# Patient Record
Sex: Female | Born: 1965
Health system: Southern US, Community
[De-identification: ages and names within clinical notes are randomized; demographics above are authoritative.]

## PROBLEM LIST (undated history)

## (undated) DIAGNOSIS — I10 Essential (primary) hypertension: Secondary | ICD-10-CM

## (undated) HISTORY — PX: BREAST CYST ASPIRATION: SHX578

## (undated) HISTORY — DX: Essential (primary) hypertension: I10

---

## 2017-11-15 DIAGNOSIS — M25561 Pain in right knee: Secondary | ICD-10-CM | POA: Diagnosis not present

## 2018-07-23 ENCOUNTER — Other Ambulatory Visit: Payer: Self-pay

## 2018-07-23 ENCOUNTER — Emergency Department (HOSPITAL_COMMUNITY): Payer: BLUE CROSS/BLUE SHIELD

## 2018-07-23 ENCOUNTER — Encounter (HOSPITAL_COMMUNITY): Payer: Self-pay

## 2018-07-23 ENCOUNTER — Emergency Department (HOSPITAL_COMMUNITY)
Admission: EM | Admit: 2018-07-23 | Discharge: 2018-07-23 | Disposition: A | Discharge status: Home / Self Care | Payer: BLUE CROSS/BLUE SHIELD | Attending: Emergency Medicine | Admitting: Emergency Medicine

## 2018-07-23 DIAGNOSIS — I499 Cardiac arrhythmia, unspecified: Secondary | ICD-10-CM | POA: Diagnosis not present

## 2018-07-23 DIAGNOSIS — F17210 Nicotine dependence, cigarettes, uncomplicated: Secondary | ICD-10-CM | POA: Diagnosis not present

## 2018-07-23 DIAGNOSIS — R05 Cough: Secondary | ICD-10-CM | POA: Diagnosis not present

## 2018-07-23 DIAGNOSIS — R0789 Other chest pain: Secondary | ICD-10-CM | POA: Insufficient documentation

## 2018-07-23 DIAGNOSIS — J069 Acute upper respiratory infection, unspecified: Secondary | ICD-10-CM | POA: Diagnosis not present

## 2018-07-23 DIAGNOSIS — R079 Chest pain, unspecified: Secondary | ICD-10-CM | POA: Diagnosis not present

## 2018-07-23 DIAGNOSIS — I10 Essential (primary) hypertension: Secondary | ICD-10-CM | POA: Diagnosis not present

## 2018-07-23 DIAGNOSIS — R0602 Shortness of breath: Secondary | ICD-10-CM | POA: Diagnosis not present

## 2018-07-23 LAB — COMPREHENSIVE METABOLIC PANEL
ALT: 14 U/L (ref 0–44)
AST: 19 U/L (ref 15–41)
Albumin: 3.8 g/dL (ref 3.5–5.0)
Alkaline Phosphatase: 55 U/L (ref 38–126)
Anion gap: 10 (ref 5–15)
BUN: 10 mg/dL (ref 6–20)
CO2: 21 mmol/L — ABNORMAL LOW (ref 22–32)
Calcium: 9.2 mg/dL (ref 8.9–10.3)
Chloride: 109 mmol/L (ref 98–111)
Creatinine, Ser: 0.79 mg/dL (ref 0.44–1.00)
GFR calc Af Amer: >60 mL/min (ref >60)
Glucose, Bld: 81 mg/dL (ref 70–99)
Potassium: 3.6 mmol/L (ref 3.5–5.1)
Sodium: 140 mmol/L (ref 135–145)
Total Bilirubin: 0.6 mg/dL (ref 0.3–1.2)
Total Protein: 6.4 g/dL — ABNORMAL LOW (ref 6.5–8.1)

## 2018-07-23 LAB — CBC WITH DIFFERENTIAL/PLATELET
Abs Immature Granulocytes: 0.03 10*3/uL (ref 0.00–0.07)
Basophils Absolute: 0.1 10*3/uL (ref 0.0–0.1)
Basophils Relative: 1 %
Eosinophils Absolute: 0.1 10*3/uL (ref 0.0–0.5)
Eosinophils Relative: 2 %
HCT: 38.6 % (ref 36.0–46.0)
Hemoglobin: 12.4 g/dL (ref 12.0–15.0)
Immature Granulocytes: 0 %
Lymphocytes Relative: 34 %
Lymphs Abs: 3.1 10*3/uL (ref 0.7–4.0)
MCH: 31.3 pg (ref 26.0–34.0)
MCHC: 32.1 g/dL (ref 30.0–36.0)
MCV: 97.5 fL (ref 80.0–100.0)
MONOS PCT: 7 %
Monocytes Absolute: 0.6 10*3/uL (ref 0.1–1.0)
Neutro Abs: 5.2 10*3/uL (ref 1.7–7.7)
Neutrophils Relative %: 56 %
Platelets: 325 10*3/uL (ref 150–400)
RBC: 3.96 MIL/uL (ref 3.87–5.11)
RDW: 12.7 % (ref 11.5–15.5)
WBC: 9.1 10*3/uL (ref 4.0–10.5)
nRBC: 0 % (ref 0.0–0.2)

## 2018-07-23 LAB — I-STAT TROPONIN, ED: Troponin i, poc: 0 ng/mL (ref 0.00–0.08)

## 2018-07-23 MED ORDER — IPRATROPIUM-ALBUTEROL 0.5-2.5 (3) MG/3ML IN SOLN
3.0000 mL | Freq: Once | RESPIRATORY_TRACT | Status: AC
  Administered 2018-07-23: 3 mL via RESPIRATORY_TRACT
  Filled 2018-07-23: qty 3

## 2018-07-23 MED ORDER — ALBUTEROL SULFATE HFA 108 (90 BASE) MCG/ACT IN AERS
2.0000 | INHALATION_SPRAY | Freq: Once | RESPIRATORY_TRACT | Status: AC
  Administered 2018-07-23: 2 via RESPIRATORY_TRACT
  Filled 2018-07-23: qty 6.7

## 2018-07-23 NOTE — ED Triage Notes (Signed)
Pt from walk in clinic via ems; c/o central chest pressure x 1 week ago; no radiation; pt endorses sinus cold x 1 weeks ago; lightheadedness and sob associated w/ chest pressure; denies chest pressure currently  Pt given 324 ASA PTA

## 2018-07-23 NOTE — ED Provider Notes (Signed)
[facility] EMERGENCY DEPARTMENT Provider Note   CSN: [id number] Arrival date & time: 07/23/18  1451    History   Chief Complaint Chief Complaint  Patient presents with  . Chest Pain    HPI Jill Moore is a 53 y.o. female.     HPI   Pt is a 53 y/o female with no known PMHx who presents to the ED today c/o chest pressure that has been present for the last week. States sxs located to the center of her chest. Denies pain. States sxs have been intermittent. Sxs have been constant today. Has intermittent sob without any specific pattern. Not specifically with exertion. None currently. She does not currently feel lightheaded. States that when she is on the treadmill her sxs seem to feel improved. She states that the more active she is the less her sxs seem to impact her.  She notes that she recently had a cold and has had nasal congestion, rhinorrhea, and a cough for the last week or so. Cough is productive of white mucous. Denies fevers.   Denies leg pain/swelling, hemoptysis, recent surgery/trauma, recent long travel, hormone use, personal hx of cancer, or hx of DVT/PE.   Pt seen at [facility] in clinic pta and sent here due to abn ekg and chest pain.   Denies known h/o HTN or DM. States she has "borderline high cholesterol" but does not take meds. Does use tobacco.   History reviewed. No pertinent past medical history.  There are no active problems to display for this patient.  History reviewed. No pertinent surgical history.   OB History   No obstetric history on file.      Home Medications    Prior to Admission medications   Not on File    Family History No family history on file.  Social History Social History   Tobacco Use  . Smoking status: Current Every Day Smoker    Packs/day: 1.00    Types: Cigarettes  . Smokeless tobacco: Never Used  Substance Use Topics  . Alcohol use: Yes    Comment: "2 glasses of wine per night"  . Drug use:  Never     Allergies   Patient has no allergy information on record.   Review of Systems Review of Systems  Constitutional: Negative for chills and fever.  HENT: Positive for congestion and rhinorrhea. Negative for ear pain and sore throat.   Eyes: Negative for visual disturbance.  Respiratory: Positive for cough and shortness of breath.   Cardiovascular: Positive for chest pain. Negative for leg swelling.  Gastrointestinal: Negative for abdominal pain, constipation, diarrhea, nausea and vomiting.  Genitourinary: Negative for dysuria and hematuria.  Musculoskeletal: Negative for back pain.  Skin: Negative for rash.  Neurological: Positive for light-headedness. Negative for dizziness, weakness, numbness and headaches.  All other systems reviewed and are negative.  Physical Exam Updated Vital Signs BP (!) 157/81 (BP Location: Right Arm)   Pulse 62   Temp 98.5 F (36.9 C) (Oral)   Resp 18   Ht  (1.626 m)   Wt 60.8 kg   SpO2 98%   BMI 23.00 kg/m   Physical Exam Vitals signs and nursing note reviewed.  Constitutional:      General: She is not in acute distress.    Appearance: She is well-developed.  HENT:     Head: Normocephalic and atraumatic.  Eyes:     Conjunctiva/sclera: Conjunctivae normal.  Neck:     Musculoskeletal: Neck supple.  Cardiovascular:     Rate and Rhythm: Normal rate and regular rhythm.     Heart sounds: No murmur.  Pulmonary:     Effort: Pulmonary effort is normal. No respiratory distress.     Breath sounds: Normal breath sounds. No decreased breath sounds, wheezing, rhonchi or rales.  Abdominal:     Palpations: Abdomen is soft.     Tenderness: There is no abdominal tenderness.  Musculoskeletal:     Right lower leg: She exhibits no tenderness. No edema.     Left lower leg: She exhibits no tenderness. No edema.  Skin:    General: Skin is warm and dry.  Neurological:     Mental Status: She is alert.     ED Treatments / Results  Labs  (all labs ordered are listed, but only abnormal results are displayed) Labs Reviewed  COMPREHENSIVE METABOLIC PANEL - Abnormal; Notable for the following components:      Result Value   CO2 21 (*)    Total Protein 6.4 (*)    All other components within normal limits  CBC WITH DIFFERENTIAL/PLATELET  I-STAT TROPONIN, ED    EKG EKG Interpretation  Date/Time:  Friday July 23 2018 15:08:08 EDT Ventricular Rate:  71 PR Interval:    QRS Duration: 68 QT Interval:  455 QTC Calculation: 495 R Axis:   76 Text Interpretation:  Sinus rhythm Consider left ventricular hypertrophy Anterior Q waves, possibly due to LVH no prior available for comparison Confirmed by Tilden Fossa [phone]) on 07/23/2018 3:50:28 PM   Radiology Dg Chest 2 View  Result Date: 07/23/2018 CLINICAL DATA:  Cough with shortness of breath and chest pain EXAM: CHEST - 2 VIEW COMPARISON:  None. FINDINGS: Lungs are clear. Heart size and pulmonary vascularity are normal. No adenopathy. No pneumothorax. No bone lesions. IMPRESSION: No edema or consolidation. Electronically Signed   By: Bretta Bang III M.D.   On: 07/23/2018 16:28    Procedures Procedures (including critical care time)  Medications Ordered in ED Medications  ipratropium-albuterol (DUONEB) 0.5-2.5 (3) MG/3ML nebulizer solution 3 mL (3 mLs Nebulization Given 07/23/18 1635)  albuterol (PROVENTIL HFA;VENTOLIN HFA) 108 (90 Base) MCG/ACT inhaler 2 puff (2 puffs Inhalation Given 07/23/18 1815)     Initial Impression / Assessment and Plan / ED Course  I have reviewed the triage vital signs and the nursing notes.  Pertinent labs & imaging results that were available during my care of the patient were reviewed by me and considered in my medical decision making (see chart for details).     Final Clinical Impressions(s) / ED Diagnoses   Final diagnoses:  Atypical chest pain  Upper respiratory tract infection, unspecified type  Hypertension, unspecified  type   Patient presenting with chest tightness in setting of recent URI symptoms.  She has no shortness of breath currently.  She is hypertensive in the ED but otherwise vital signs are within normal limits.  Low suspicion for hypertensive emergency at this time.  CBC is reassuring.  No leukocytosis or anemia.  CMP with normal electrolytes kidney and liver function.  Bicarb is very mildly low however I feel this is nonspecific and not contributing to symptoms.  Troponin is negative.  Chest x-ray is negative for acute cardiac or pulmonary etiology  EKG shows normal sinus rhythm with no ischemic changes with LVH.  Patient's work-up is very reassuring.  I think that her symptoms are likely related to her recent URI.  Her symptoms seem very atypical for ACS given the  fact that her symptoms seem to be improved with exertion.  her symptoms also seem less likely to be secondary to PE as she has no risk factors at this time.  She is not tachycardic.  She is not hypoxic.  She has no shortness of breath.  I gave her a DuoNeb and on reevaluation she states that she feels much improved.  She is ambulating about the room in no acute distress and stating that she is is ready for discharge.  I will give her an albuterol inhaler for home.  I offered to treat her cough with cough medication however she declined a prescription for this and states she will buy something over-the-counter.  Advised her that she needs to follow-up with her PCP in regards to her visit today in regards to her elevated blood pressure readings.  Specific return precautions were discussed and she voices understanding of the plan and reasons to return.  All questions were answered.  Patient stable discharge.    ED Discharge Orders    None       Rayne Du 07/23/18 2059    Tilden Fossa, MD 07/23/18 2321

## 2018-07-23 NOTE — ED Notes (Signed)
Patient verbalizes understanding of discharge instructions. Opportunity for questioning and answers were provided. Pt discharged from ED. 

## 2018-07-23 NOTE — Discharge Instructions (Addendum)
Please follow up with your primary care provider within 5-7 days for re-evaluation of your symptoms.   Please return to the emergency department for any new or worsening symptoms including chest pain, shortness of breath, headaches, dizziness or lightheadedness.

## 2018-07-23 NOTE — ED Notes (Signed)
Pt to xray via stretcher

## 2018-08-23 DIAGNOSIS — I10 Essential (primary) hypertension: Secondary | ICD-10-CM | POA: Diagnosis not present

## 2018-08-23 DIAGNOSIS — Z720 Tobacco use: Secondary | ICD-10-CM | POA: Diagnosis not present

## 2018-08-23 DIAGNOSIS — R0789 Other chest pain: Secondary | ICD-10-CM | POA: Diagnosis not present

## 2018-09-03 ENCOUNTER — Telehealth: Payer: Self-pay | Admitting: Internal Medicine

## 2018-09-03 NOTE — Telephone Encounter (Signed)
Smartphone/consent/ my chart/ pre reg completed °

## 2018-09-06 ENCOUNTER — Encounter: Payer: Self-pay | Admitting: Internal Medicine

## 2018-09-06 ENCOUNTER — Telehealth (INDEPENDENT_AMBULATORY_CARE_PROVIDER_SITE_OTHER): Payer: BLUE CROSS/BLUE SHIELD | Admitting: Internal Medicine

## 2018-09-06 ENCOUNTER — Telehealth: Payer: BLUE CROSS/BLUE SHIELD | Admitting: Internal Medicine

## 2018-09-06 VITALS — BP 138/71 | HR 80 | Ht 63.0 in | Wt 131.2 lb

## 2018-09-06 DIAGNOSIS — I10 Essential (primary) hypertension: Secondary | ICD-10-CM | POA: Diagnosis not present

## 2018-09-06 DIAGNOSIS — R0789 Other chest pain: Secondary | ICD-10-CM

## 2018-09-06 DIAGNOSIS — Z716 Tobacco abuse counseling: Secondary | ICD-10-CM

## 2018-09-06 DIAGNOSIS — Z79899 Other long term (current) drug therapy: Secondary | ICD-10-CM

## 2018-09-06 DIAGNOSIS — Z720 Tobacco use: Secondary | ICD-10-CM | POA: Insufficient documentation

## 2018-09-06 NOTE — Progress Notes (Signed)
Virtual Visit via Video Note   This visit type was conducted due to national recommendations for restrictions regarding the COVID-19 Pandemic (e.g. social distancing) in an effort to limit this patient's exposure and mitigate transmission in our community.  Due to her co-morbid illnesses, this patient is at least at moderate risk for complications without adequate follow up.  This format is felt to be most appropriate for this patient at this time.  All issues noted in this document were discussed and addressed.  A limited physical exam was performed with this format.  Please refer to the patient's chart for her consent to telehealth for [facility].   Evaluation Performed:  Video visit  Date:  09/06/2018   ID:  Jill Moore, DOB [birth year], MRN [id number]  Patient Location:  [address] [place] [address]  Provider location:   [address], [address] [place], [place] [address]  PCP:  Farris Has, MD  Cardiologist:  No primary care provider on file. Electrophysiologist:  None   Chief Complaint:  New patient, ER follow-up for chest pain  History of Present Illness:    Jill Moore is a 53 y.o. female who presents via audio/video conferencing for a telehealth visit today.  This is a pleasant 53 year old female who recently moved to the area from [place].  She has a past medical history recently significant for hypertension although was untreated.  She is also been a long-term smoker between 1 and 1-1/2 packs/day.  She was seen in the ER in March of this year for chest pain.  She had had some upper respiratory symptoms.  She was describing a dull pressure in the center of her chest which was fairly constant.  It was not worse with exertion or relieved by rest.  She says she exercises fairly regularly and is kind of a "gym rat".  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  She does not seem to have worsening symptoms with that.  Work-up in the ER was fairly  benign.  Her chest x-ray was normal.  Her EKG did show some LVH by voltage and her blood pressure was elevated.  Additionally, her troponin was negative.  Since then she has had no new or worsening chest pain.  She does get occasional pressure which is more constant.  She also gets some intercostal crampy pain in certain positions.  She does describe some numbness and tingling of the last 3 fingers of the right hand.  She denies any cough, fever, chills or other concerning symptoms.  Family history significant for her mother who had TIA/stroke and hypertension.  She also has several siblings with diabetes and hypertension.  Her father had hypertension but died of cancer.  The patient does not have symptoms concerning for COVID-19 infection (fever, chills, cough, or new SHORTNESS OF BREATH).    Prior CV studies:   The following studies were reviewed today:  ER visit EKG Labs  PMHx:  Past Medical History:  Diagnosis Date   Essential hypertension     No past surgical history on file.  FAMHx:  Family History  Problem Relation Age of Onset   Stroke Mother    Hypertension Mother    Cancer Father    Hypertension Father    Diabetes Sister    Hypertension Sister    Diabetes Brother    Hypertension Brother     SOCHx:   reports that she has been smoking cigarettes. She has been smoking about 1.50 packs  per day. She has never used smokeless tobacco. She reports current alcohol use. She reports that she does not use drugs.  ALLERGIES:  No Known Allergies  MEDS:  Current Meds  Medication Sig   amLODipine (NORVASC) 5 MG tablet Take 5 mg by mouth daily.   Multiple Vitamins-Minerals (MULTIVITAMIN ADULT PO) Take by mouth.     ROS: Pertinent items noted in HPI and remainder of comprehensive ROS otherwise negative.  Labs/Other Tests and Data Reviewed:    Recent Labs: 07/23/2018: ALT 14; BUN 10; Creatinine, Ser 0.79; Hemoglobin 12.4; Platelets 325; Potassium 3.6; Sodium 140     Recent Lipid Panel No results found for: CHOL, TRIG, HDL, CHOLHDL, LDLCALC, LDLDIRECT  Wt Readings from Last 3 Encounters:  09/06/18 131 lb 3.2 oz (59.5 kg)  07/23/18 134 lb (60.8 kg)     Exam:    Vital Signs:  BP 138/71    Pulse 80    Ht 5\' 3"  (1.6 m)    Wt 131 lb 3.2 oz (59.5 kg)    BMI 23.24 kg/m    General appearance: alert and no distress Lungs: no audible or visual breathing difficulty Extremities: extremities normal, atraumatic, no cyanosis or edema Skin: Skin color, texture, turgor normal. No rashes or lesions Neurologic: Grossly normal Psych: Pleasant  ASSESSMENT & PLAN:    1. Atypical chest pain 2. Essential hypertension 3. Tobacco abuse  Jill Moore was recently seen in the ER with atypical chest pain.  I suspect some of this discomfort might be musculoskeletal as she has been describing crampy pain in her right rib cage as well as some numbness and tingling in the last 3 digits of her right arm.  This is sometimes worse when leaning forward with her head down and may be even coming from her neck.  Her blood pressure had been elevated.  Her PCP put her on amlodipine with an improvement in her symptoms.  He is trying to get records from her previous primary care doctor in [place].  I would like to get a fasting lipid profile now, however to further risk stratify her.  In addition, I think she is a good candidate for coronary artery calcium scoring.  This will allow [place] to see if she has any premature onset coronary disease, and additionally get a glimpse at her lungs for possible pulmonary nodule since she has a significant smoking history.  We did discuss smoking cessation today at length.  She says she is quit in the past at one point for up to 7 years but then restarted.  Currently she is smoking more because she is at home and is under a lot of stress and suffers from dysthymia.  Plan follow-up with me in about 4 months.  COVID-19 Education: The signs and symptoms of  COVID-19 were discussed with the patient and how to seek care for testing (follow up with PCP or arrange E-visit).  The importance of social distancing was discussed today.  Patient Risk:   After full review of this patients clinical status, I feel that they are at least moderate risk at this time.  Time:   Today, I have spent 25 minutes with the patient with telehealth technology discussing chest pain, shortness of breath, smoking cessation, cardiovascular risk factors, recent ER visit.     Medication Adjustments/Labs and Tests Ordered: Current medicines are reviewed at length with the patient today.  Concerns regarding medicines are outlined above.   Tests Ordered: No orders of the defined types were  placed in this encounter.   Medication Changes: No orders of the defined types were placed in this encounter.   Disposition:  in 4 month(s)  Chrystie NoseKenneth C. Sheryn Aldaz, MD, [facility]berts[facility] Memorial HospitalFACC, FACP  [place]   [facility]  Medical Director of the Advanced Lipid Disorders &  Cardiovascular Risk Reduction Clinic Diplomate of the American Board of Clinical Lipidology Attending Cardiologist  Direct Dial: [phone]   Fax: [phone]  Website:  [email or web address]  Chrystie NoseKenneth C Vincenzina Jagoda, MD  09/06/2018 9:16 AM

## 2018-09-06 NOTE — Patient Instructions (Signed)
Medication Instructions:  Your Physician recommend you continue on your current medication as directed.    If you need a refill on your cardiac medications before your next appointment, please call your pharmacy.   Lab work: Your physician recommends that you return for lab work in today (Lipid)  If you have labs (blood work) drawn today and your tests are completely normal, you will receive your results only by: Marland Kitchen MyChart Message (if you have MyChart) OR . A paper copy in the mail If you have any lab test that is abnormal or we need to change your treatment, we will call you to review the results.  Testing/Procedures: Cardiac Score [address]  Follow-Up: At [facility], you and your health needs are our priority.  As part of our continuing mission to provide you with exceptional heart care, we have created designated Provider Care Teams.  These Care Teams include your primary Cardiologist (physician) and Advanced Practice Providers (APPs -  Physician Assistants and Nurse Practitioners) who all work together to provide you with the care you need, when you need it. You will need a follow up appointment in 4 months.  Please call our office 2 months in advance to schedule this appointment.  You may see Dr. Rennis Golden or one of the following Advanced Practice Providers on your designated Care Team: Azalee Course, [place] . Micah Flesher, PA-C  Any Other Special Instructions Will Be Listed Below (If Applicable). None

## 2018-09-08 DIAGNOSIS — R0789 Other chest pain: Secondary | ICD-10-CM | POA: Diagnosis not present

## 2018-09-08 DIAGNOSIS — Z79899 Other long term (current) drug therapy: Secondary | ICD-10-CM | POA: Diagnosis not present

## 2018-09-08 LAB — LIPID PANEL
Chol/HDL Ratio: 3.6 ratio (ref 0.0–4.4)
Cholesterol, Total: 225 mg/dL — ABNORMAL HIGH (ref 100–199)
HDL: 62 mg/dL (ref >39)
LDL Calculated: 142 mg/dL — ABNORMAL HIGH (ref 0–99)
Triglycerides: 104 mg/dL (ref 0–149)
VLDL Cholesterol Cal: 21 mg/dL (ref 5–40)

## 2018-10-07 ENCOUNTER — Ambulatory Visit (INDEPENDENT_AMBULATORY_CARE_PROVIDER_SITE_OTHER): Payer: BLUE CROSS/BLUE SHIELD

## 2018-10-07 ENCOUNTER — Ambulatory Visit: Payer: BLUE CROSS/BLUE SHIELD | Admitting: Podiatry

## 2018-10-07 ENCOUNTER — Other Ambulatory Visit: Payer: Self-pay

## 2018-10-07 DIAGNOSIS — G5761 Lesion of plantar nerve, right lower limb: Secondary | ICD-10-CM

## 2018-10-07 NOTE — Patient Instructions (Signed)
Morton Neuralgia    Morton neuralgia is foot pain that affects the ball of the foot and the area near the toes. Morton neuralgia occurs when part of a nerve in the foot (digital nerve) is under too much pressure (compressed). When this happens over a long period of time, the nerve can thicken (neuroma) and cause pain. Pain usually occurs between the third and fourth toes.   Morton neuralgia can come and go but may get worse over time.  What are the causes?  This condition is caused by doing the same things over and over with your foot, such as:  · Activities such as running or jumping.  · Wearing shoes that are too tight.  What increases the risk?  You may be at higher risk for Morton neuralgia if you:  · Are female.  · Wear high heels.  · Wear shoes that are narrow or tight.  · Do activities that repeatedly stretch your toes, such as:  ? Running.  ? Ballet.  ? Long-distance walking.  What are the signs or symptoms?  The first symptom of Morton neuralgia is pain that spreads from the ball of the foot to the toes. It may feel like you are walking on a marble. Pain usually gets worse with walking and goes away at night. Other symptoms may include numbness and cramping of your toes. Both feet are equally affected, but rarely at the same time.  How is this diagnosed?  This condition is diagnosed based on your symptoms, your medical history, and a physical exam. Your health care provider may:  · Squeeze your foot just behind your toe.  · Ask you to move your toes to check for pain.  · Ask about your physical activity level.  You also may have imaging tests, such as an X-ray, ultrasound, or MRI.  How is this treated?  Treatment depends on how severe your condition is and what causes it. Treatment may involve:  · Wearing different shoes that are not too tight, are low-heeled, and provide good support. For some people, this is the only treatment needed.  · Wearing an over-the-counter or custom supportive pad (orthotic)  under the front of your foot.  · Getting injections of numbing medicine and anti-inflammatory medicine (steroid) in the nerve.  · Having surgery to remove part of the thickened nerve.  Follow these instructions at home:  Managing pain, stiffness, and swelling    · Massage your foot as needed.  · Wear orthotics as told by your health care provider.  · If directed, put ice on your foot:  ? Put ice in a plastic bag.  ? Place a towel between your skin and the bag.  ? Leave the ice on for 20 minutes, 2-3 times a day.  · Avoid activities that cause pain or make pain worse. If you play sports, ask your health care provider when it is safe for you to return to sports.  · Raise (elevate) your foot above the level of your heart while lying down and, when possible, while sitting.  General instructions  · Take over-the-counter and prescription medicines only as told by your health care provider.  · Do not drive or use heavy machinery while taking prescription pain medicine.  · Wear shoes that:  ? Have soft soles.  ? Have a wide toe area.  ? Provide arch support.  ? Do not pinch or squeeze your feet.  ? Have room for your orthotics, if applicable.  ·   Keep all follow-up visits as told by your health care provider. This is important.  Contact a health care provider if:  · Your symptoms get worse or do not get better with treatment and home care.  Summary  · Morton neuralgia is foot pain that affects the ball of the foot and the area near the toes. Pain usually occurs between the third and fourth toes, gets worse with walking, and goes away at night.  · Morton neuralgia occurs when part of a nerve in the foot (digital nerve) is under too much pressure. When this happens over a long period of time, the nerve can thicken (neuroma) and cause pain.  · This condition is caused by doing the same things over and over with your foot, such as running or jumping, wearing shoes that are too tight, or wearing high heels.  · Treatment may  involve wearing low-heeled shoes that are not too tight, wearing a supportive pad (orthotic) under the front of your foot, getting injections in the nerve, or having surgery to remove part of the thickened nerve.  This information is not intended to replace advice given to you by your health care provider. Make sure you discuss any questions you have with your health care provider.  Document Released: 08/04/2000 Document Revised: 05/12/2017 Document Reviewed: 05/12/2017  Elsevier Interactive Patient Education © 2019 [employer]

## 2018-10-10 DIAGNOSIS — G5761 Lesion of plantar nerve, right lower limb: Secondary | ICD-10-CM | POA: Insufficient documentation

## 2018-10-10 NOTE — Progress Notes (Signed)
Subjective:   Patient ID: Jill Moore, female   DOB: 53 y.o.   MRN: [id number]   HPI 52 year old female presents the office today for concerns of pain to the right foot.  This is been ongoing for about 20 years.  She states that she previously was told she likely has a Morton's neuroma.  She states that she gets pain also numbness and burning to her fourth toe and some of the left third toe when she does a lot of walking.  She states that she can walk about 1.5 miles before it starts to hurt.  She said no recent injury or trauma.  No swelling.  No proximal radiating pain.   Review of Systems  All other systems reviewed and are negative.  Past Medical History:  Diagnosis Date  . Essential hypertension     No past surgical history on file.   Current Outpatient Medications:  .  amLODipine (NORVASC) 5 MG tablet, Take 5 mg by mouth daily., Disp: , Rfl:  .  Multiple Vitamins-Minerals (MULTIVITAMIN ADULT PO), Take by mouth., Disp: , Rfl:   No Known Allergies      Objective:  Physical Exam  General: AAO x3, NAD  Dermatological: Skin is warm, dry and supple bilateral. Nails x 10 are well manicured; remaining integument appears unremarkable at this time. There are no open sores, no preulcerative lesions, no rash or signs of infection present.  Vascular: Dorsalis Pedis artery and Posterior Tibial artery pedal pulses are 2/4 bilateral with immedate capillary fill time. Pedal hair growth present. No varicosities and no lower extremity edema present bilateral. There is no pain with calf compression, swelling, warmth, erythema.   Neruologic: Grossly intact via light touch bilateral. Protective threshold with Semmes Wienstein monofilament intact to all pedal sites bilateral.  Negative Tinel sign.  Musculoskeletal: There is tenderness palpation along the third interspace between the third and fourth metatarsal heads.  Small clicking sensation is present consistent with a Morton's neuroma.   There is no area pinpoint tenderness.  Muscular strength 5/5 in all groups tested bilateral.  Gait: Unassisted, Nonantalgic.       Assessment:   Morton's neuroma    Plan:  -Treatment options discussed including all alternatives, risks, and complications -Etiology of symptoms were discussed -X-rays were obtained and reviewed with the patient. No evidence of acute fracture or stress fracture. -Steroid injection approximately.  See procedure note below. -I added an over-the-counter insert.  Consider custom inserts as well.  Procedure: Injection Morton's neuroma Discussed alternatives, risks, complications and verbal consent was obtained.  Location: Right third interspace Skin Prep: Alcohol Injectate: 0.5cc 0.5% marcaine plain, 0.5 cc 2% lidocaine plain and, 1 cc kenalog 10. Disposition: Patient tolerated procedure well. Injection site dressed with a band-aid.  Post-injection care was discussed and return precautions discussed.   Vivi Barrack DPM

## 2018-11-04 DIAGNOSIS — I10 Essential (primary) hypertension: Secondary | ICD-10-CM | POA: Diagnosis not present

## 2018-11-08 DIAGNOSIS — Z0000 Encounter for general adult medical examination without abnormal findings: Secondary | ICD-10-CM | POA: Diagnosis not present

## 2018-11-08 DIAGNOSIS — Z23 Encounter for immunization: Secondary | ICD-10-CM | POA: Diagnosis not present

## 2018-11-16 ENCOUNTER — Ambulatory Visit (INDEPENDENT_AMBULATORY_CARE_PROVIDER_SITE_OTHER)
Admission: RE | Admit: 2018-11-16 | Discharge: 2018-11-16 | Disposition: A | Discharge status: Home / Self Care | Payer: Self-pay | Source: Ambulatory Visit | Attending: Internal Medicine | Admitting: Internal Medicine

## 2018-11-16 ENCOUNTER — Other Ambulatory Visit: Payer: Self-pay

## 2018-11-16 DIAGNOSIS — R0789 Other chest pain: Secondary | ICD-10-CM

## 2018-11-17 ENCOUNTER — Other Ambulatory Visit: Payer: Self-pay

## 2018-11-17 NOTE — Progress Notes (Signed)
error 

## 2018-11-18 ENCOUNTER — Other Ambulatory Visit: Payer: Self-pay

## 2018-11-19 ENCOUNTER — Other Ambulatory Visit: Payer: Self-pay | Admitting: Internal Medicine

## 2018-11-19 DIAGNOSIS — R918 Other nonspecific abnormal finding of lung field: Secondary | ICD-10-CM

## 2018-11-22 ENCOUNTER — Other Ambulatory Visit: Payer: Self-pay

## 2018-11-22 ENCOUNTER — Encounter: Payer: Self-pay | Admitting: Podiatry

## 2018-11-22 ENCOUNTER — Ambulatory Visit: Payer: BC Managed Care – PPO | Admitting: Podiatry

## 2018-11-22 VITALS — Temp 96.8°F

## 2018-11-22 DIAGNOSIS — G5761 Lesion of plantar nerve, right lower limb: Secondary | ICD-10-CM | POA: Diagnosis not present

## 2018-11-24 NOTE — Progress Notes (Signed)
Subjective: 53 year old female presents the office today for evaluation of a neuroma on the right foot.  She said that she is doing better since the injection.  She states that she was hiking and she got up 5 miles before she started having discomfort and she had a rest and she was able to finish the 8 mile hike without any significant discomfort.  No recent swelling or injury. Denies any systemic complaints such as fevers, chills, nausea, vomiting. No acute changes since last appointment, and no other complaints at this time.   Objective: AAO x3, NAD DP/PT pulses palpable bilaterally, CRT less than 3 seconds Mild discomfort in the third interspace of the right foot there is no edema, erythema.  There is no area pinpoint tenderness. No open lesions or pre-ulcerative lesions.  No pain with calf compression, swelling, warmth, erythema  Assessment: Neuroma right foot  Plan: -All treatment options discussed with the patient including all alternatives, risks, complications.  -Discussed multiple treatment options.  She was to hold off another steroid injection today as she is can be going out of town.  We discussed other treatment options including alcohol sclerosing injection, surgical retention.  However we decided do that when to get an ultrasound prior to surgery.  I will continue with supportive shoes, orthotics and I dispensed a neuroma pad again today as this has been helpful as well. -Patient encouraged to call the office with any questions, concerns, change in symptoms.   Trula Slade DPM

## 2018-12-13 DIAGNOSIS — Z1211 Encounter for screening for malignant neoplasm of colon: Secondary | ICD-10-CM | POA: Diagnosis not present

## 2018-12-13 DIAGNOSIS — Z1212 Encounter for screening for malignant neoplasm of rectum: Secondary | ICD-10-CM | POA: Diagnosis not present

## 2019-03-10 DIAGNOSIS — M9905 Segmental and somatic dysfunction of pelvic region: Secondary | ICD-10-CM | POA: Diagnosis not present

## 2019-03-10 DIAGNOSIS — M25552 Pain in left hip: Secondary | ICD-10-CM | POA: Diagnosis not present

## 2019-03-10 DIAGNOSIS — M461 Sacroiliitis, not elsewhere classified: Secondary | ICD-10-CM | POA: Diagnosis not present

## 2019-03-10 DIAGNOSIS — M9904 Segmental and somatic dysfunction of sacral region: Secondary | ICD-10-CM | POA: Diagnosis not present

## 2019-03-14 DIAGNOSIS — M25552 Pain in left hip: Secondary | ICD-10-CM | POA: Diagnosis not present

## 2019-03-14 DIAGNOSIS — M9904 Segmental and somatic dysfunction of sacral region: Secondary | ICD-10-CM | POA: Diagnosis not present

## 2019-03-14 DIAGNOSIS — M9905 Segmental and somatic dysfunction of pelvic region: Secondary | ICD-10-CM | POA: Diagnosis not present

## 2019-03-14 DIAGNOSIS — M461 Sacroiliitis, not elsewhere classified: Secondary | ICD-10-CM | POA: Diagnosis not present

## 2019-03-17 ENCOUNTER — Ambulatory Visit: Payer: BC Managed Care – PPO | Admitting: Podiatry

## 2019-03-17 ENCOUNTER — Other Ambulatory Visit: Payer: Self-pay

## 2019-03-17 DIAGNOSIS — G5761 Lesion of plantar nerve, right lower limb: Secondary | ICD-10-CM

## 2019-03-17 DIAGNOSIS — M9904 Segmental and somatic dysfunction of sacral region: Secondary | ICD-10-CM | POA: Diagnosis not present

## 2019-03-17 DIAGNOSIS — M9905 Segmental and somatic dysfunction of pelvic region: Secondary | ICD-10-CM | POA: Diagnosis not present

## 2019-03-17 DIAGNOSIS — M9903 Segmental and somatic dysfunction of lumbar region: Secondary | ICD-10-CM | POA: Diagnosis not present

## 2019-03-17 DIAGNOSIS — M461 Sacroiliitis, not elsewhere classified: Secondary | ICD-10-CM | POA: Diagnosis not present

## 2019-03-17 DIAGNOSIS — M25552 Pain in left hip: Secondary | ICD-10-CM | POA: Diagnosis not present

## 2019-03-22 DIAGNOSIS — M9904 Segmental and somatic dysfunction of sacral region: Secondary | ICD-10-CM | POA: Diagnosis not present

## 2019-03-22 DIAGNOSIS — M461 Sacroiliitis, not elsewhere classified: Secondary | ICD-10-CM | POA: Diagnosis not present

## 2019-03-22 DIAGNOSIS — M9905 Segmental and somatic dysfunction of pelvic region: Secondary | ICD-10-CM | POA: Diagnosis not present

## 2019-03-22 DIAGNOSIS — M25552 Pain in left hip: Secondary | ICD-10-CM | POA: Diagnosis not present

## 2019-03-25 DIAGNOSIS — M9903 Segmental and somatic dysfunction of lumbar region: Secondary | ICD-10-CM | POA: Diagnosis not present

## 2019-03-25 DIAGNOSIS — M9905 Segmental and somatic dysfunction of pelvic region: Secondary | ICD-10-CM | POA: Diagnosis not present

## 2019-03-25 DIAGNOSIS — M461 Sacroiliitis, not elsewhere classified: Secondary | ICD-10-CM | POA: Diagnosis not present

## 2019-03-25 DIAGNOSIS — M25552 Pain in left hip: Secondary | ICD-10-CM | POA: Diagnosis not present

## 2019-03-25 DIAGNOSIS — M9904 Segmental and somatic dysfunction of sacral region: Secondary | ICD-10-CM | POA: Diagnosis not present

## 2019-03-28 NOTE — Progress Notes (Signed)
Subjective: 53 year old female presents the office requesting a steroid injection for neuroma in the right foot.  She states this started become very painful again and she was doing for symptom injection as it was helpful previously.  She states that it is not "super bad" yet but she wants to not wait.  No recent injury or swelling. Denies any systemic complaints such as fevers, chills, nausea, vomiting. No acute changes since last appointment, and no other complaints at this time.   Objective: AAO x3, NAD DP/PT pulses palpable bilaterally, CRT less than 3 seconds There is tenderness to palpation along the right foot along the third interspace and palpable neuromas present.  No edema, erythema.  There is no area of pinpoint tenderness.  No open lesions or pre-ulcerative lesions.  No pain with calf compression, swelling, warmth, erythema  Assessment: Right foot neuroma, requesting injection  Plan: -All treatment options discussed with the patient including all alternatives, risks, complications.  -Steroid injection performed.  See procedure note below. -Neuroma pad.  Discussed wearing supportive shoes and orthotics. -Patient encouraged to call the office with any questions, concerns, change in symptoms.   Procedure: Injection neuroma Discussed alternatives, risks, complications and verbal consent was obtained.  Location: Right foot neuroma Skin Prep: Alcohol Injectate: 0.5cc 0.5% marcaine plain, 0.5 cc 2% lidocaine plain and, 1 cc kenalog 10. Disposition: Patient tolerated procedure well. Injection site dressed with a band-aid.  Post-injection care was discussed and return precautions discussed.   Trula Slade DPM

## 2019-04-04 DIAGNOSIS — M9905 Segmental and somatic dysfunction of pelvic region: Secondary | ICD-10-CM | POA: Diagnosis not present

## 2019-04-04 DIAGNOSIS — M461 Sacroiliitis, not elsewhere classified: Secondary | ICD-10-CM | POA: Diagnosis not present

## 2019-04-04 DIAGNOSIS — M9904 Segmental and somatic dysfunction of sacral region: Secondary | ICD-10-CM | POA: Diagnosis not present

## 2019-04-04 DIAGNOSIS — M25552 Pain in left hip: Secondary | ICD-10-CM | POA: Diagnosis not present

## 2019-04-04 DIAGNOSIS — M9903 Segmental and somatic dysfunction of lumbar region: Secondary | ICD-10-CM | POA: Diagnosis not present

## 2019-04-06 DIAGNOSIS — M9903 Segmental and somatic dysfunction of lumbar region: Secondary | ICD-10-CM | POA: Diagnosis not present

## 2019-04-06 DIAGNOSIS — M9905 Segmental and somatic dysfunction of pelvic region: Secondary | ICD-10-CM | POA: Diagnosis not present

## 2019-04-06 DIAGNOSIS — M9904 Segmental and somatic dysfunction of sacral region: Secondary | ICD-10-CM | POA: Diagnosis not present

## 2019-04-06 DIAGNOSIS — M25552 Pain in left hip: Secondary | ICD-10-CM | POA: Diagnosis not present

## 2019-04-06 DIAGNOSIS — M461 Sacroiliitis, not elsewhere classified: Secondary | ICD-10-CM | POA: Diagnosis not present

## 2019-04-13 DIAGNOSIS — M461 Sacroiliitis, not elsewhere classified: Secondary | ICD-10-CM | POA: Diagnosis not present

## 2019-04-13 DIAGNOSIS — M25552 Pain in left hip: Secondary | ICD-10-CM | POA: Diagnosis not present

## 2019-04-13 DIAGNOSIS — M9905 Segmental and somatic dysfunction of pelvic region: Secondary | ICD-10-CM | POA: Diagnosis not present

## 2019-04-13 DIAGNOSIS — M9903 Segmental and somatic dysfunction of lumbar region: Secondary | ICD-10-CM | POA: Diagnosis not present

## 2019-04-13 DIAGNOSIS — M9904 Segmental and somatic dysfunction of sacral region: Secondary | ICD-10-CM | POA: Diagnosis not present

## 2019-04-27 DIAGNOSIS — M461 Sacroiliitis, not elsewhere classified: Secondary | ICD-10-CM | POA: Diagnosis not present

## 2019-04-27 DIAGNOSIS — M25552 Pain in left hip: Secondary | ICD-10-CM | POA: Diagnosis not present

## 2019-04-27 DIAGNOSIS — M9904 Segmental and somatic dysfunction of sacral region: Secondary | ICD-10-CM | POA: Diagnosis not present

## 2019-04-27 DIAGNOSIS — M9905 Segmental and somatic dysfunction of pelvic region: Secondary | ICD-10-CM | POA: Diagnosis not present

## 2019-05-03 ENCOUNTER — Ambulatory Visit: Payer: BC Managed Care – PPO | Admitting: Podiatry

## 2019-05-03 ENCOUNTER — Encounter: Payer: Self-pay | Admitting: Podiatry

## 2019-05-03 ENCOUNTER — Other Ambulatory Visit: Payer: Self-pay

## 2019-05-03 DIAGNOSIS — B351 Tinea unguium: Secondary | ICD-10-CM | POA: Diagnosis not present

## 2019-05-03 DIAGNOSIS — G5761 Lesion of plantar nerve, right lower limb: Secondary | ICD-10-CM

## 2019-05-03 MED ORDER — EFINACONAZOLE 10 % EX SOLN: 1.0000 [drp] | Freq: Every day | CUTANEOUS | 11 refills | Status: AC

## 2019-05-03 NOTE — Patient Instructions (Signed)
Efinaconazole Topical Solution What is this medicine? EFINACONAZOLE (e FEE na KON a zole) is an antifungal medicine. It is used to treat certain kinds of fungal infections of the toenail. This medicine may be used for other purposes; ask your health care provider or pharmacist if you have questions. COMMON BRAND NAME(S): JUBLIA What should I tell my health care provider before I take this medicine? They need to know if you have any of these conditions:  an unusual or allergic reaction to efinaconazole, other medicines, foods, dyes or preservatives  pregnant or trying to get pregnant  breast-feeding How should I use this medicine? This medicine is for external use only. Do not take by mouth. Follow the directions on the label. Wash hands before and after use. Apply this medicine using the provided brush to cover the entire toenail. Do not use your medicine more often than directed. Finish the full course prescribed by your doctor or health care professional even if you think your condition is better. Do not stop using except on the advice of your doctor or health care professional. Talk to your pediatrician regarding the use of this medicine in children. While this drug may be prescribed for children as young as 6 years for selected conditions, precautions do apply. Overdosage: If you think you have taken too much of this medicine contact a poison control center or emergency room at once. NOTE: This medicine is only for you. Do not share this medicine with others. What if I miss a dose? If you miss a dose, use it as soon as you can. If it is almost time for your next dose, use only that dose. Do not use double or extra doses. What may interact with this medicine? Interactions have not been studied. Do not use any other nail products (i.e., nail polish, pedicures) during treatment with this medicine. This list may not describe all possible interactions. Give your health care provider a list of  all the medicines, herbs, non-prescription drugs, or dietary supplements you use. Also tell them if you smoke, drink alcohol, or use illegal drugs. Some items may interact with your medicine. What should I watch for while using this medicine? Do not get this medicine in your eyes. If you do, rinse out with plenty of cool tap water. Tell your doctor or health care professional if your symptoms do not start to get better or if they get worse. Wait for at least 10 minutes after bathing before applying this medication. After bathing, make sure that your feet are very dry. Fungal infections like moist conditions. Do not walk around barefoot. To help prevent reinfection, wear freshly washed cotton, not synthetic clothing. Tell your doctor or health care professional if you develop sores or blisters that do not heal properly. If your nail infection returns after you stop using this medicine, contact your doctor or health care professional. What side effects may I notice from receiving this medicine? Side effects that you should report to your doctor or health care professional as soon as possible:  allergic reactions like skin rash, itching or hives, swelling of the face, lips, or tongue  ingrown toenail Side effects that usually do not require medical attention (report to your doctor or health care professional if they continue or are bothersome):  mild skin irritation, burning, or itching This list may not describe all possible side effects. Call your doctor for medical advice about side effects. You may report side effects to FDA at 1-800-FDA-1088. Where should I   keep my medicine? Keep out of the reach of children. Store at room temperature between 20 and 25 degrees C (68 and 77 degrees F). Keep this medicine in the original container. Throw away any unused medicine after the expiration date. This medicine is flammable. Avoid exposure to heat, fire, flame, and smoking. NOTE: This sheet is a summary.  It may not cover all possible information. If you have questions about this medicine, talk to your doctor, pharmacist, or health care provider.  2020 Elsevier/Gold Standard (2018-09-06 16:14:11)  

## 2019-05-09 NOTE — Progress Notes (Signed)
Subjective: 53 year old female presents the office today for follow-up evaluation of a neuroma, pain of the right foot.  She states that she still gets intermittent discomfort in the area.  The first injection was very helpful with the second 1 was not as helpful.  Denies any recent injury.  He also states that she has been using topical antifungal medicine over-the-counter without any significant improvement she mainly notices her nails are starting to become more discolored.  No pain in the nails. Denies any systemic complaints such as fevers, chills, nausea, vomiting. No acute changes since last appointment, and no other complaints at this time.   Objective: AAO x3, NAD DP/PT pulses palpable bilaterally, CRT less than 3 seconds There is tenderness on the third interspace of the right foot.  Small palpable neuroma is identified.  There is no area of pinpoint tenderness.  The nails are mildly dystrophic, discolored with yellow and white discoloration.  No pain in the nails there is no signs of infection.  No open lesions or pre-ulcerative lesions.  No pain with calf compression, swelling, warmth, erythema  Assessment: Right foot neuroma, onychomycosis  Plan: -All treatment options discussed with the patient including all alternatives, risks, complications.  -Steroid injection performed in the neuroma today.  See procedure note below.  If she continues no symptoms discussed dehydrated alcohol injection or surgery.  Likely order an ultrasound prior to this. -Prescribe Jublia.  If not covered by insurance we can try medication through [place] [facility] excluding the urea.  -Patient encouraged to call the office with any questions, concerns, change in symptoms.   Procedure: Injection neuroma  Discussed alternatives, risks, complications and verbal consent was obtained.  Location: Right 3rd interspace Skin Prep: Alcohol  Injectate: 0.5cc 0.5% marcaine plain, 0.5 cc 2% lidocaine plain and, 1 cc  dexamethasone  Disposition: Patient tolerated procedure well. Injection site dressed with a band-aid.  Post-injection care was discussed and return precautions discussed.   Return if symptoms worsen or fail to improve.  Trula Slade DPM

## 2019-05-17 ENCOUNTER — Telehealth: Payer: Self-pay | Admitting: *Deleted

## 2019-05-17 DIAGNOSIS — G5761 Lesion of plantar nerve, right lower limb: Secondary | ICD-10-CM

## 2019-05-17 NOTE — Telephone Encounter (Signed)
We discussed a diagnostic ultrasound of the right foot to rule out a neuroma of the 3rd interspace if no improvement. Can you please go ahead and order this? Thanks.

## 2019-05-17 NOTE — Telephone Encounter (Signed)
Pt states she would like to be scheduled for an [place].

## 2019-05-18 NOTE — Telephone Encounter (Signed)
Faxed orders to [facility]. 

## 2019-05-26 ENCOUNTER — Other Ambulatory Visit: Payer: BC Managed Care – PPO

## 2019-06-01 ENCOUNTER — Other Ambulatory Visit: Payer: Self-pay | Admitting: Podiatry

## 2019-06-01 ENCOUNTER — Ambulatory Visit
Admission: RE | Admit: 2019-06-01 | Discharge: 2019-06-01 | Disposition: A | Discharge status: Home / Self Care | Payer: BLUE CROSS/BLUE SHIELD | Source: Ambulatory Visit | Attending: Podiatry | Admitting: Podiatry

## 2019-06-01 ENCOUNTER — Other Ambulatory Visit: Payer: Self-pay

## 2019-06-01 DIAGNOSIS — G5761 Lesion of plantar nerve, right lower limb: Secondary | ICD-10-CM

## 2019-06-01 DIAGNOSIS — M79671 Pain in right foot: Secondary | ICD-10-CM | POA: Diagnosis not present

## 2019-06-06 NOTE — Progress Notes (Signed)
Called patient per Dr Ardelle Anton and went over the ultrasound with the patient and patient wanted to see about getting new shoes and trying them first and if that doesn't work for the shoes then patient will come in and try the inserts and I stated to the patient to give it a good 4 weeks and try to do the normal activities and to go and get the shoes from [employer] and see one of the physical therapist over there. Misty Stanley

## 2019-07-13 ENCOUNTER — Other Ambulatory Visit: Payer: Self-pay | Admitting: Nurse Practitioner

## 2019-07-13 DIAGNOSIS — Z1231 Encounter for screening mammogram for malignant neoplasm of breast: Secondary | ICD-10-CM

## 2019-07-29 DIAGNOSIS — Z720 Tobacco use: Secondary | ICD-10-CM | POA: Diagnosis not present

## 2019-07-29 DIAGNOSIS — I10 Essential (primary) hypertension: Secondary | ICD-10-CM | POA: Diagnosis not present

## 2019-07-29 DIAGNOSIS — K649 Unspecified hemorrhoids: Secondary | ICD-10-CM | POA: Diagnosis not present

## 2019-08-03 DIAGNOSIS — D229 Melanocytic nevi, unspecified: Secondary | ICD-10-CM | POA: Diagnosis not present

## 2019-08-03 DIAGNOSIS — L732 Hidradenitis suppurativa: Secondary | ICD-10-CM | POA: Diagnosis not present

## 2019-08-11 ENCOUNTER — Ambulatory Visit
Admission: RE | Admit: 2019-08-11 | Discharge: 2019-08-11 | Disposition: A | Discharge status: Home / Self Care | Payer: BC Managed Care – PPO | Source: Ambulatory Visit | Attending: Nurse Practitioner | Admitting: Nurse Practitioner

## 2019-08-11 ENCOUNTER — Other Ambulatory Visit: Payer: Self-pay

## 2019-08-11 DIAGNOSIS — Z1231 Encounter for screening mammogram for malignant neoplasm of breast: Secondary | ICD-10-CM | POA: Diagnosis not present

## 2019-08-12 ENCOUNTER — Other Ambulatory Visit: Payer: Self-pay | Admitting: Nurse Practitioner

## 2019-08-12 DIAGNOSIS — R928 Other abnormal and inconclusive findings on diagnostic imaging of breast: Secondary | ICD-10-CM

## 2019-08-16 ENCOUNTER — Other Ambulatory Visit: Payer: Self-pay | Admitting: Family Medicine

## 2019-08-16 DIAGNOSIS — R928 Other abnormal and inconclusive findings on diagnostic imaging of breast: Secondary | ICD-10-CM

## 2019-08-19 ENCOUNTER — Other Ambulatory Visit: Payer: Self-pay

## 2019-08-19 ENCOUNTER — Other Ambulatory Visit: Payer: Self-pay | Admitting: Family Medicine

## 2019-08-19 ENCOUNTER — Ambulatory Visit
Admission: RE | Admit: 2019-08-19 | Discharge: 2019-08-19 | Disposition: A | Discharge status: Home / Self Care | Payer: BC Managed Care – PPO | Source: Ambulatory Visit | Attending: Nurse Practitioner | Admitting: Nurse Practitioner

## 2019-08-19 DIAGNOSIS — R921 Mammographic calcification found on diagnostic imaging of breast: Secondary | ICD-10-CM

## 2019-08-19 DIAGNOSIS — R928 Other abnormal and inconclusive findings on diagnostic imaging of breast: Secondary | ICD-10-CM

## 2019-08-29 DIAGNOSIS — Z6822 Body mass index (BMI) 22.0-22.9, adult: Secondary | ICD-10-CM | POA: Diagnosis not present

## 2019-08-29 DIAGNOSIS — Z01419 Encounter for gynecological examination (general) (routine) without abnormal findings: Secondary | ICD-10-CM | POA: Diagnosis not present

## 2019-08-29 DIAGNOSIS — Z1151 Encounter for screening for human papillomavirus (HPV): Secondary | ICD-10-CM | POA: Diagnosis not present

## 2019-10-27 ENCOUNTER — Telehealth: Payer: Self-pay | Admitting: Internal Medicine

## 2019-10-27 NOTE — Telephone Encounter (Signed)
Spoke with patient regarding appointment for CT chest no contrast scheduled Friday 11/18/19 at 9:00 am---arrival time is 8:45 am  [name] at [address].  Patient voiced her understanding.

## 2019-11-11 DIAGNOSIS — E785 Hyperlipidemia, unspecified: Secondary | ICD-10-CM | POA: Diagnosis not present

## 2019-11-11 DIAGNOSIS — I10 Essential (primary) hypertension: Secondary | ICD-10-CM | POA: Diagnosis not present

## 2019-11-17 DIAGNOSIS — Z0000 Encounter for general adult medical examination without abnormal findings: Secondary | ICD-10-CM | POA: Diagnosis not present

## 2019-11-18 ENCOUNTER — Ambulatory Visit (INDEPENDENT_AMBULATORY_CARE_PROVIDER_SITE_OTHER)
Admission: RE | Admit: 2019-11-18 | Discharge: 2019-11-18 | Disposition: A | Discharge status: Home / Self Care | Payer: BC Managed Care – PPO | Source: Ambulatory Visit | Attending: Internal Medicine | Admitting: Internal Medicine

## 2019-11-18 ENCOUNTER — Other Ambulatory Visit: Payer: Self-pay

## 2019-11-18 DIAGNOSIS — R918 Other nonspecific abnormal finding of lung field: Secondary | ICD-10-CM

## 2019-11-21 ENCOUNTER — Other Ambulatory Visit: Payer: Self-pay | Admitting: Internal Medicine

## 2019-11-21 DIAGNOSIS — R918 Other nonspecific abnormal finding of lung field: Secondary | ICD-10-CM

## 2019-11-21 DIAGNOSIS — R911 Solitary pulmonary nodule: Secondary | ICD-10-CM

## 2019-11-21 DIAGNOSIS — Z720 Tobacco use: Secondary | ICD-10-CM

## 2019-12-09 DIAGNOSIS — M25531 Pain in right wrist: Secondary | ICD-10-CM | POA: Diagnosis not present

## 2020-02-20 ENCOUNTER — Ambulatory Visit
Admission: RE | Admit: 2020-02-20 | Discharge: 2020-02-20 | Disposition: A | Discharge status: Home / Self Care | Payer: BC Managed Care – PPO | Source: Ambulatory Visit | Attending: Family Medicine | Admitting: Family Medicine

## 2020-02-20 ENCOUNTER — Other Ambulatory Visit: Payer: Self-pay

## 2020-02-20 ENCOUNTER — Other Ambulatory Visit: Payer: Self-pay | Admitting: Family Medicine

## 2020-02-20 DIAGNOSIS — R921 Mammographic calcification found on diagnostic imaging of breast: Secondary | ICD-10-CM

## 2020-08-14 DIAGNOSIS — M9901 Segmental and somatic dysfunction of cervical region: Secondary | ICD-10-CM | POA: Diagnosis not present

## 2020-08-14 DIAGNOSIS — M542 Cervicalgia: Secondary | ICD-10-CM | POA: Diagnosis not present

## 2020-08-14 DIAGNOSIS — M9902 Segmental and somatic dysfunction of thoracic region: Secondary | ICD-10-CM | POA: Diagnosis not present

## 2020-08-14 DIAGNOSIS — M546 Pain in thoracic spine: Secondary | ICD-10-CM | POA: Diagnosis not present

## 2020-08-21 DIAGNOSIS — M546 Pain in thoracic spine: Secondary | ICD-10-CM | POA: Diagnosis not present

## 2020-08-21 DIAGNOSIS — M9901 Segmental and somatic dysfunction of cervical region: Secondary | ICD-10-CM | POA: Diagnosis not present

## 2020-08-21 DIAGNOSIS — M542 Cervicalgia: Secondary | ICD-10-CM | POA: Diagnosis not present

## 2020-08-21 DIAGNOSIS — M9902 Segmental and somatic dysfunction of thoracic region: Secondary | ICD-10-CM | POA: Diagnosis not present

## 2020-09-04 DIAGNOSIS — M9901 Segmental and somatic dysfunction of cervical region: Secondary | ICD-10-CM | POA: Diagnosis not present

## 2020-09-04 DIAGNOSIS — M542 Cervicalgia: Secondary | ICD-10-CM | POA: Diagnosis not present

## 2020-09-04 DIAGNOSIS — M546 Pain in thoracic spine: Secondary | ICD-10-CM | POA: Diagnosis not present

## 2020-09-04 DIAGNOSIS — M9902 Segmental and somatic dysfunction of thoracic region: Secondary | ICD-10-CM | POA: Diagnosis not present

## 2020-09-11 DIAGNOSIS — M9901 Segmental and somatic dysfunction of cervical region: Secondary | ICD-10-CM | POA: Diagnosis not present

## 2020-09-11 DIAGNOSIS — M9902 Segmental and somatic dysfunction of thoracic region: Secondary | ICD-10-CM | POA: Diagnosis not present

## 2020-09-11 DIAGNOSIS — M542 Cervicalgia: Secondary | ICD-10-CM | POA: Diagnosis not present

## 2020-09-11 DIAGNOSIS — M546 Pain in thoracic spine: Secondary | ICD-10-CM | POA: Diagnosis not present

## 2020-09-18 DIAGNOSIS — M546 Pain in thoracic spine: Secondary | ICD-10-CM | POA: Diagnosis not present

## 2020-09-18 DIAGNOSIS — M542 Cervicalgia: Secondary | ICD-10-CM | POA: Diagnosis not present

## 2020-09-18 DIAGNOSIS — M9901 Segmental and somatic dysfunction of cervical region: Secondary | ICD-10-CM | POA: Diagnosis not present

## 2020-09-18 DIAGNOSIS — M9902 Segmental and somatic dysfunction of thoracic region: Secondary | ICD-10-CM | POA: Diagnosis not present

## 2020-09-25 DIAGNOSIS — M546 Pain in thoracic spine: Secondary | ICD-10-CM | POA: Diagnosis not present

## 2020-09-25 DIAGNOSIS — M9901 Segmental and somatic dysfunction of cervical region: Secondary | ICD-10-CM | POA: Diagnosis not present

## 2020-09-25 DIAGNOSIS — M542 Cervicalgia: Secondary | ICD-10-CM | POA: Diagnosis not present

## 2020-09-25 DIAGNOSIS — M9902 Segmental and somatic dysfunction of thoracic region: Secondary | ICD-10-CM | POA: Diagnosis not present

## 2020-10-02 DIAGNOSIS — M546 Pain in thoracic spine: Secondary | ICD-10-CM | POA: Diagnosis not present

## 2020-10-02 DIAGNOSIS — M9901 Segmental and somatic dysfunction of cervical region: Secondary | ICD-10-CM | POA: Diagnosis not present

## 2020-10-02 DIAGNOSIS — M542 Cervicalgia: Secondary | ICD-10-CM | POA: Diagnosis not present

## 2020-10-02 DIAGNOSIS — M9902 Segmental and somatic dysfunction of thoracic region: Secondary | ICD-10-CM | POA: Diagnosis not present

## 2020-10-09 DIAGNOSIS — M9902 Segmental and somatic dysfunction of thoracic region: Secondary | ICD-10-CM | POA: Diagnosis not present

## 2020-10-09 DIAGNOSIS — M542 Cervicalgia: Secondary | ICD-10-CM | POA: Diagnosis not present

## 2020-10-09 DIAGNOSIS — M546 Pain in thoracic spine: Secondary | ICD-10-CM | POA: Diagnosis not present

## 2020-10-09 DIAGNOSIS — M9901 Segmental and somatic dysfunction of cervical region: Secondary | ICD-10-CM | POA: Diagnosis not present

## 2020-10-16 DIAGNOSIS — M542 Cervicalgia: Secondary | ICD-10-CM | POA: Diagnosis not present

## 2020-10-16 DIAGNOSIS — M546 Pain in thoracic spine: Secondary | ICD-10-CM | POA: Diagnosis not present

## 2020-10-16 DIAGNOSIS — M9901 Segmental and somatic dysfunction of cervical region: Secondary | ICD-10-CM | POA: Diagnosis not present

## 2020-10-16 DIAGNOSIS — M25552 Pain in left hip: Secondary | ICD-10-CM | POA: Diagnosis not present

## 2020-10-16 DIAGNOSIS — M9902 Segmental and somatic dysfunction of thoracic region: Secondary | ICD-10-CM | POA: Diagnosis not present

## 2020-10-16 DIAGNOSIS — M9906 Segmental and somatic dysfunction of lower extremity: Secondary | ICD-10-CM | POA: Diagnosis not present

## 2020-10-16 DIAGNOSIS — M5451 Vertebrogenic low back pain: Secondary | ICD-10-CM | POA: Diagnosis not present

## 2020-10-22 DIAGNOSIS — M542 Cervicalgia: Secondary | ICD-10-CM | POA: Diagnosis not present

## 2020-10-22 DIAGNOSIS — M9902 Segmental and somatic dysfunction of thoracic region: Secondary | ICD-10-CM | POA: Diagnosis not present

## 2020-10-22 DIAGNOSIS — M9901 Segmental and somatic dysfunction of cervical region: Secondary | ICD-10-CM | POA: Diagnosis not present

## 2020-10-22 DIAGNOSIS — M546 Pain in thoracic spine: Secondary | ICD-10-CM | POA: Diagnosis not present

## 2020-11-07 DIAGNOSIS — M25552 Pain in left hip: Secondary | ICD-10-CM | POA: Diagnosis not present

## 2020-11-07 DIAGNOSIS — M9906 Segmental and somatic dysfunction of lower extremity: Secondary | ICD-10-CM | POA: Diagnosis not present

## 2020-11-07 DIAGNOSIS — M542 Cervicalgia: Secondary | ICD-10-CM | POA: Diagnosis not present

## 2020-11-07 DIAGNOSIS — M9901 Segmental and somatic dysfunction of cervical region: Secondary | ICD-10-CM | POA: Diagnosis not present

## 2020-11-07 DIAGNOSIS — M9902 Segmental and somatic dysfunction of thoracic region: Secondary | ICD-10-CM | POA: Diagnosis not present

## 2020-11-07 DIAGNOSIS — M9905 Segmental and somatic dysfunction of pelvic region: Secondary | ICD-10-CM | POA: Diagnosis not present

## 2020-11-07 DIAGNOSIS — M546 Pain in thoracic spine: Secondary | ICD-10-CM | POA: Diagnosis not present

## 2020-11-16 IMAGING — MG DIGITAL DIAGNOSTIC UNILAT RIGHT W/ CAD
4 series · 4 of 4 positions shown · non-contrast
Comparison: Screening mammogram dated 08/11/2019.

CLINICAL DATA: Patient was called back from screening mammogram for
right breast calcifications.

EXAM:
DIGITAL DIAGNOSTIC RIGHT MAMMOGRAM WITH CAD

[R ML (1 of 3)]
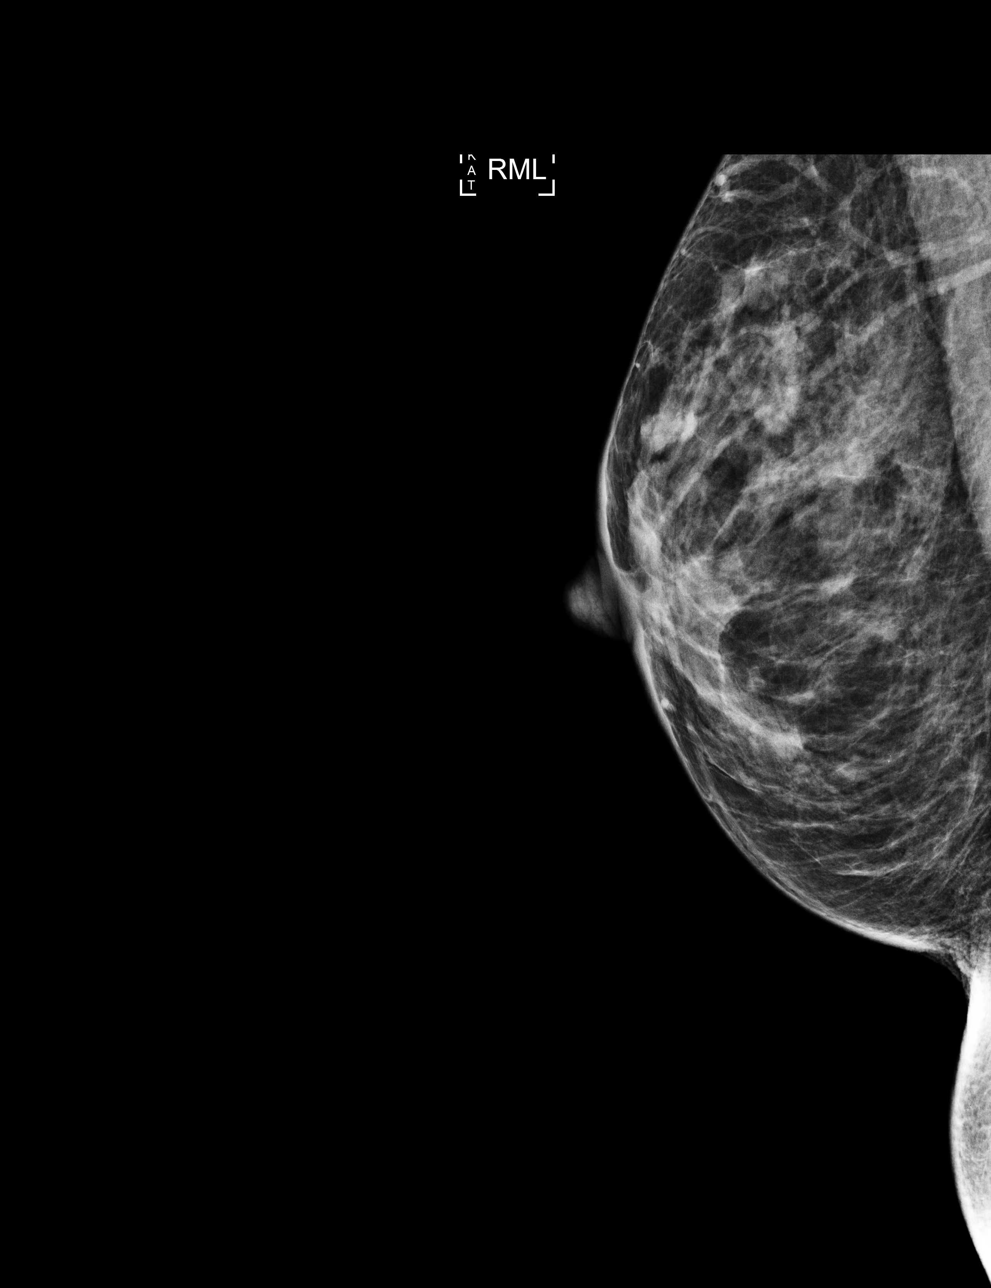

[R ML (2 of 3)]
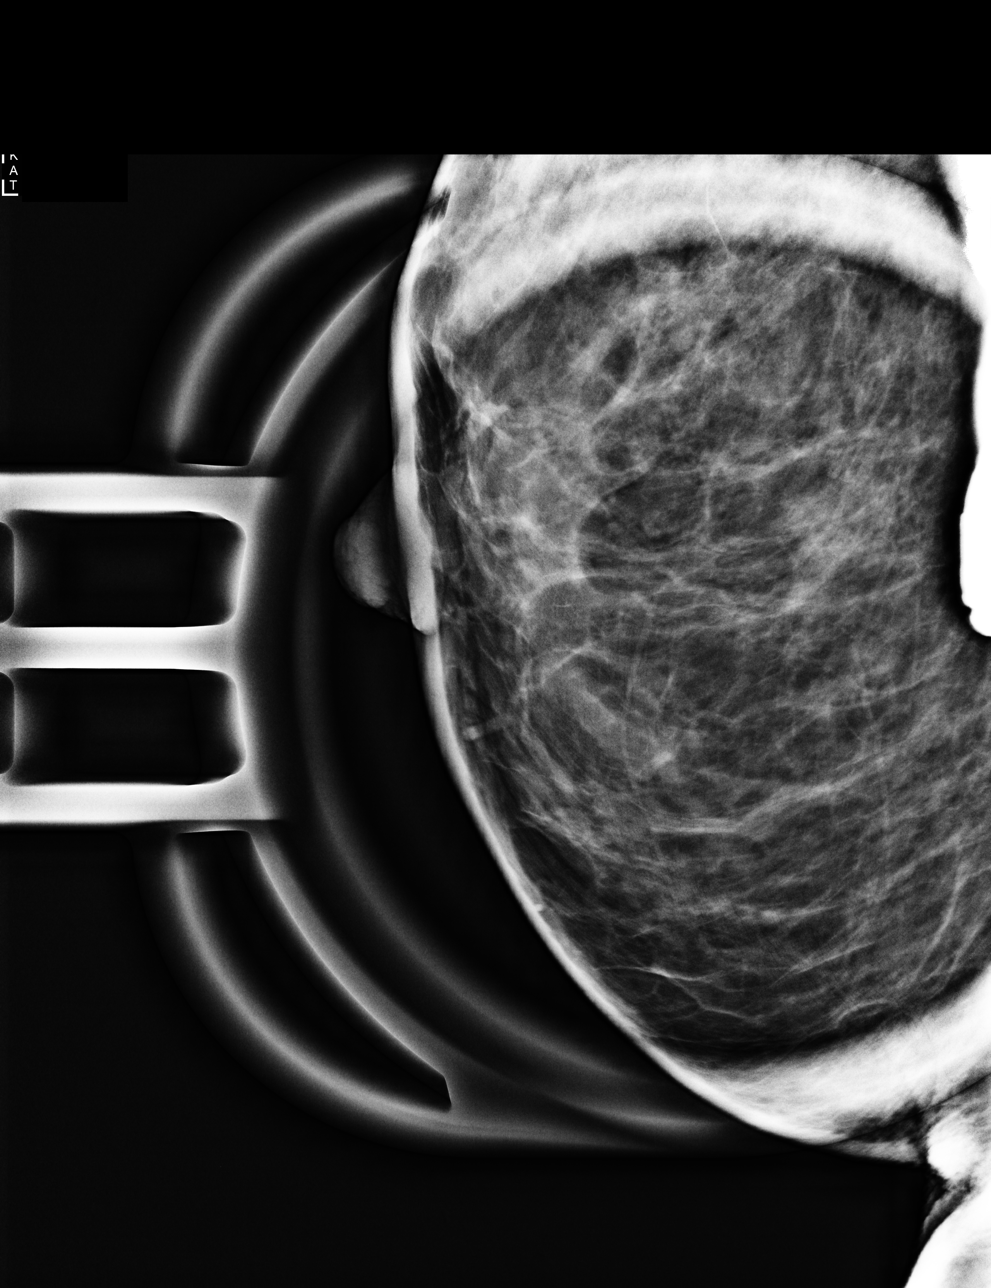

[R CC]
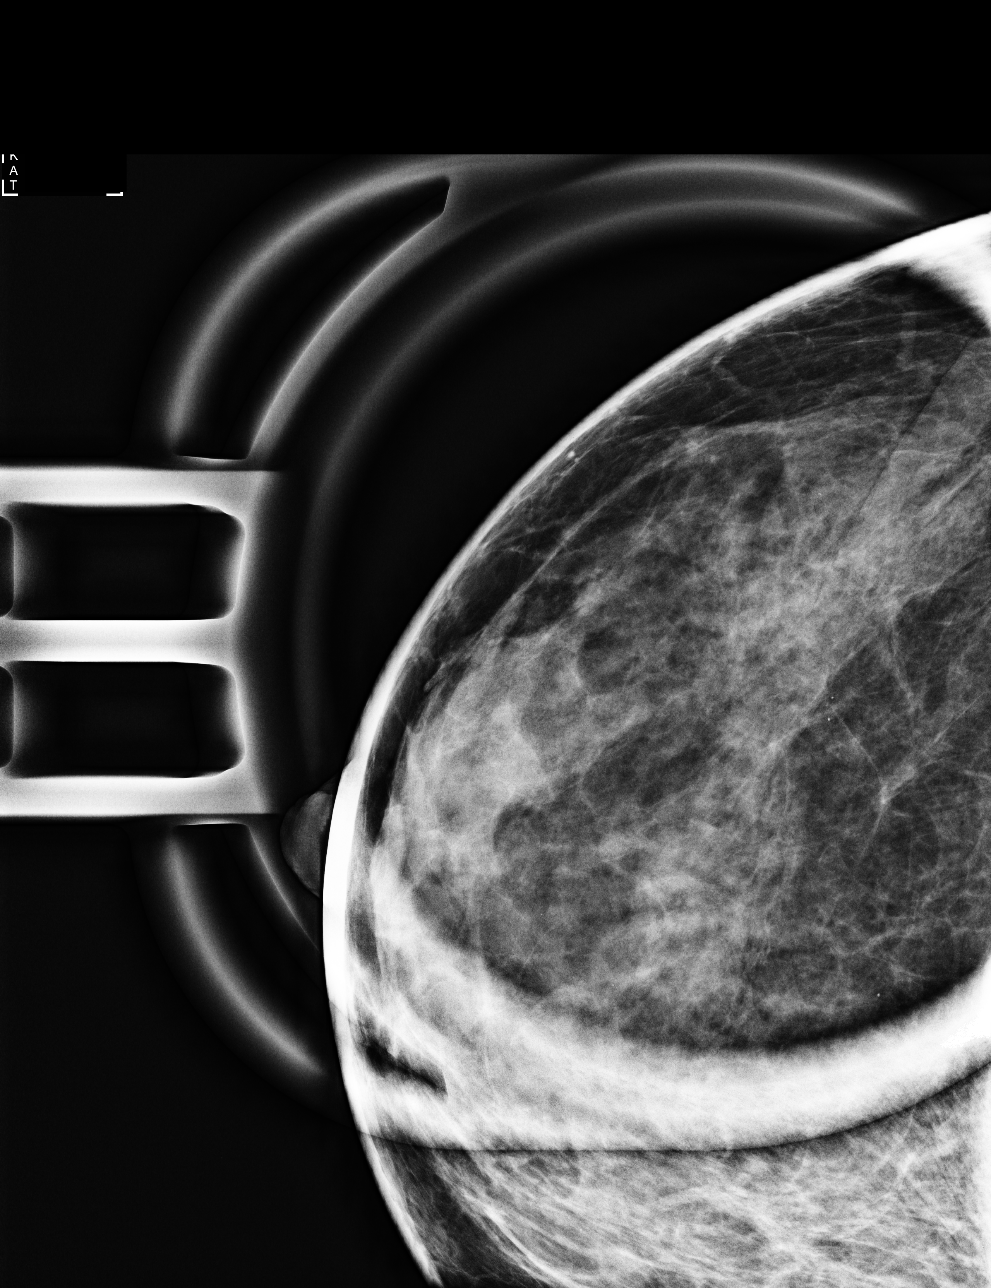

[R ML (3 of 3)]
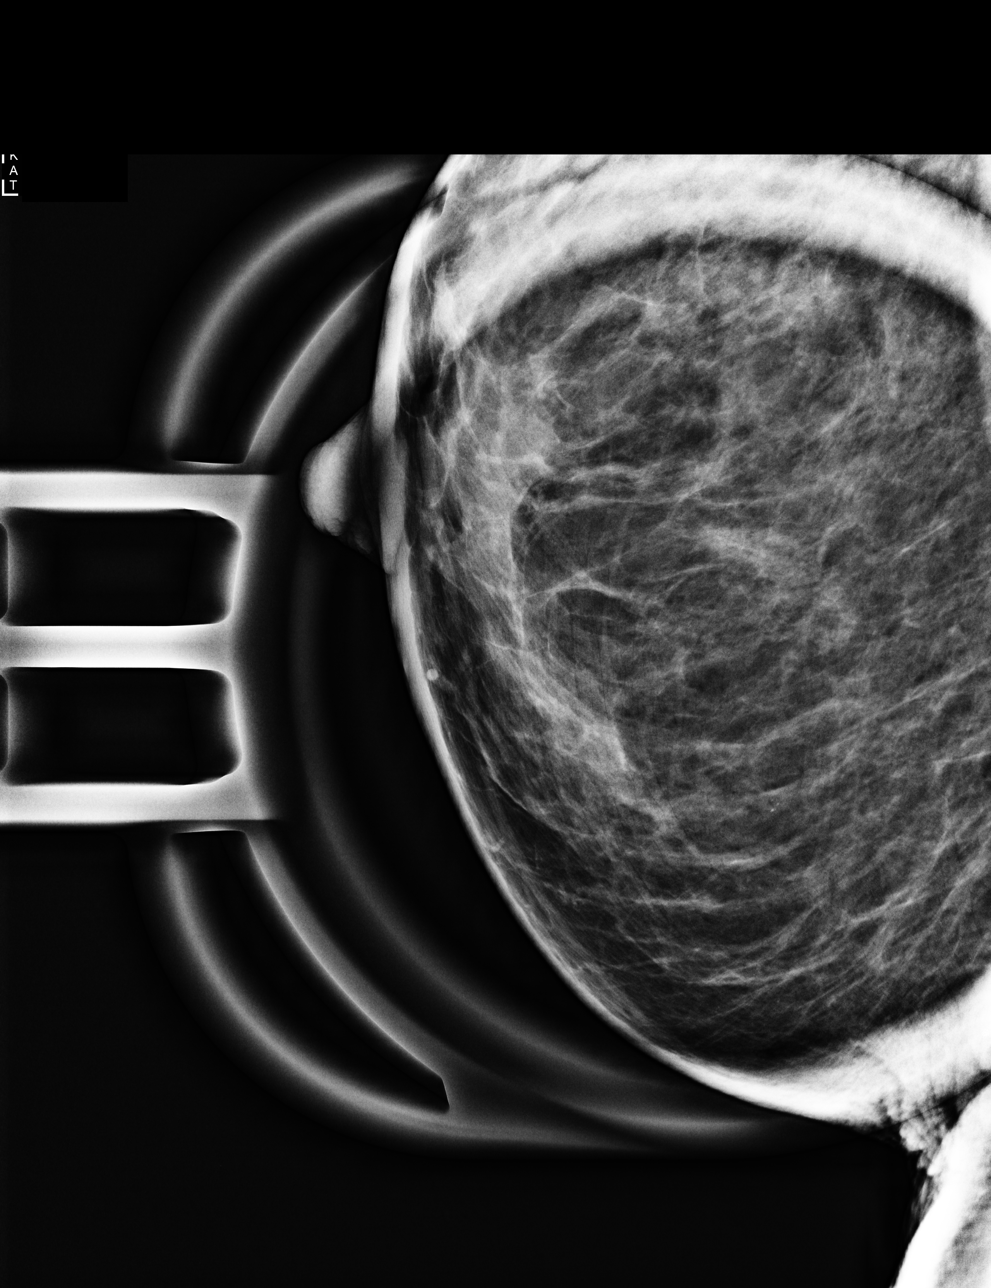

[4 of 4 positions shown; findings below may reference images not displayed]

ACR Breast Density Category b: There are scattered areas of
fibroglandular density.
FINDINGS: Additional imaging of the right breast was performed. There are
loosely grouped punctate calcifications spanning an area of 2 mm
that are felt to likely be benign. There is no suspicious mass.

Mammographic images were processed with CAD.
IMPRESSION: Probable benign calcifications in the right breast.

RECOMMENDATION:
Short-term interval right diagnostic mammogram in 6 months is
recommended.

I have discussed the findings and recommendations with the patient.
If applicable, a reminder letter will be sent to the patient
regarding the next appointment.

BI-RADS CATEGORY  3: Probably benign.

## 2021-01-18 ENCOUNTER — Encounter: Payer: Self-pay | Admitting: Podiatry

## 2021-01-18 ENCOUNTER — Ambulatory Visit (INDEPENDENT_AMBULATORY_CARE_PROVIDER_SITE_OTHER): Payer: Self-pay | Admitting: Podiatry

## 2021-01-18 ENCOUNTER — Other Ambulatory Visit: Payer: Self-pay

## 2021-01-18 DIAGNOSIS — M7741 Metatarsalgia, right foot: Secondary | ICD-10-CM

## 2021-01-18 DIAGNOSIS — G5761 Lesion of plantar nerve, right lower limb: Secondary | ICD-10-CM

## 2021-01-18 DIAGNOSIS — M7742 Metatarsalgia, left foot: Secondary | ICD-10-CM

## 2021-01-18 MED ORDER — DEXAMETHASONE SODIUM PHOSPHATE 120 MG/30ML IJ SOLN
4.0000 mg | Freq: Once | INTRAMUSCULAR | Status: AC
  Administered 2021-01-18: 4 mg via INTRA_ARTICULAR

## 2021-01-18 NOTE — Progress Notes (Signed)
Subjective: 55 year old female presents the office today for evaluation of neuroma to the right foot.  She states that she is to get pain in the third interspace that she gets numbness and tingling in the toes.  After last saw her she had improvement about 1 year after the injection but the pain started to come back.  She is been trying to hike and she gets discomfort about 1 mile into hiking.  She does get some generalized pain to the ball of her foot as well and the pain starts to recur.  Denies any recent injury or trauma.  No swelling.  She has no other concerns.  Objective: AAO x3, NAD DP/PT pulses palpable bilaterally, CRT less than 3 seconds There is tenderness palpation of the third interspace on the right foot.  There is no significant palpable neuroma identified today.  There is no area of pinpoint tenderness.  No pain to the MPJs.  No edema, erythema.  Chest of the discomfort is submetatarsal 1 through 5 on the right foot but no other areas of discomfort identified today.  No pain with calf compression, swelling, warmth, erythema  Assessment: Likely neuroma right foot, metatarsalgia  Plan: -All treatment options discussed with the patient including all alternatives, risks, complications.  -Although previous ultrasound did not show a significant neuroma she still has symptoms of this.  She was to pursue another injection today as she had good improvement last appointment.  See procedure note below. -Discussed metatarsal, neuroma pads as well to help with metatarsalgia.  I asked her to bring her hiking boots in the office and we can work on trying to add these as well -Patient encouraged to call the office with any questions, concerns, change in symptoms.   Procedure: Injection neuroma  Discussed alternatives, risks, complications and verbal consent was obtained.  Location: Right 3rd interspace Skin Prep: Alcohol. Injectate: 0.5cc 0.5% marcaine plain, 0.5 cc 2% lidocaine plain and, 1 cc  kenalog 10. Disposition: Patient tolerated procedure well. Injection site dressed with a band-aid.  Post-injection care was discussed and return precautions discussed.   Vivi Barrack DPM

## 2021-03-14 DIAGNOSIS — Z716 Tobacco abuse counseling: Secondary | ICD-10-CM | POA: Diagnosis not present

## 2021-03-14 DIAGNOSIS — Z87891 Personal history of nicotine dependence: Secondary | ICD-10-CM | POA: Diagnosis not present

## 2021-07-29 DIAGNOSIS — I10 Essential (primary) hypertension: Secondary | ICD-10-CM | POA: Diagnosis not present

## 2021-07-29 DIAGNOSIS — E785 Hyperlipidemia, unspecified: Secondary | ICD-10-CM | POA: Diagnosis not present

## 2021-07-29 DIAGNOSIS — Z720 Tobacco use: Secondary | ICD-10-CM | POA: Diagnosis not present

## 2021-07-29 DIAGNOSIS — Z0000 Encounter for general adult medical examination without abnormal findings: Secondary | ICD-10-CM | POA: Diagnosis not present

## 2021-07-29 DIAGNOSIS — Z5181 Encounter for therapeutic drug level monitoring: Secondary | ICD-10-CM | POA: Diagnosis not present

## 2021-09-10 ENCOUNTER — Other Ambulatory Visit: Payer: Self-pay

## 2021-09-10 DIAGNOSIS — F17210 Nicotine dependence, cigarettes, uncomplicated: Secondary | ICD-10-CM

## 2021-09-10 DIAGNOSIS — Z87891 Personal history of nicotine dependence: Secondary | ICD-10-CM

## 2021-09-10 DIAGNOSIS — Z122 Encounter for screening for malignant neoplasm of respiratory organs: Secondary | ICD-10-CM

## 2021-10-08 ENCOUNTER — Ambulatory Visit (INDEPENDENT_AMBULATORY_CARE_PROVIDER_SITE_OTHER): Payer: Self-pay | Admitting: Acute Care

## 2021-10-08 ENCOUNTER — Encounter: Payer: Self-pay | Admitting: Acute Care

## 2021-10-08 DIAGNOSIS — F17210 Nicotine dependence, cigarettes, uncomplicated: Secondary | ICD-10-CM

## 2021-10-08 NOTE — Progress Notes (Signed)
Virtual Visit via Telephone Note  I connected with Jill Moore on 10/08/21 at  3:00 PM EDT by a video enabled telemedicine application and verified that I am speaking with the correct person using two identifiers.  Location: Patient:  At home Provider:  [address] [place], [place], [address]    I discussed the limitations of evaluation and management by telemedicine and the availability of in person appointments. The patient expressed understanding and agreed to proceed.  Shared Decision Making Visit Lung Cancer Screening Program [phone])   Eligibility: Age 56 y.o. Pack Years Smoking History Calculation 63.75 pack year smoking history (# packs/per year x # years smoked) Recent History of coughing up blood  no Unexplained weight loss? no ( >Than 15 pounds within the last 6 months ) Prior History Lung / other cancer no (Diagnosis within the last 5 years already requiring surveillance chest CT Scans). Smoking Status Current Smoker Former Smokers: Years since quit:  NA  Quit Date:  NA  Visit Components: Discussion included one or more decision making aids. yes Discussion included risk/benefits of screening. yes Discussion included potential follow up diagnostic testing for abnormal scans. yes Discussion included meaning and risk of over diagnosis. yes Discussion included meaning and risk of False Positives. yes Discussion included meaning of total radiation exposure. yes  Counseling Included: Importance of adherence to annual lung cancer LDCT screening. yes Impact of comorbidities on ability to participate in the program. yes Ability and willingness to under diagnostic treatment. yes  Smoking Cessation Counseling: Current Smokers:  Discussed importance of smoking cessation. yes Information about tobacco cessation classes and interventions provided to patient. yes Patient provided with "ticket" for LDCT Scan. NA Symptomatic Patient. no  Counseling  NA Diagnosis Code: Tobacco Use Z72.0 Asymptomatic Patient yes  Counseling (Intermediate counseling: > three minutes counseling) G3151 Former Smokers:  Discussed the importance of maintaining cigarette abstinence. yes Diagnosis Code: Personal History of Nicotine Dependence. V61.607 Information about tobacco cessation classes and interventions provided to patient. Yes Patient provided with "ticket" for LDCT Scan. yes Written Order for Lung Cancer Screening with LDCT placed in Epic. Yes (CT Chest Lung Cancer Screening Low Dose W/O CM) [id number] Z12.2-Screening of respiratory organs Z87.891-Personal history of nicotine dependence  I have spent 25 minutes of face to face/ virtual visit   time with  Jill Moore discussing the risks and benefits of lung cancer screening. We viewed / discussed a power point together that explained in detail the above noted topics. We paused at intervals to allow for questions to be asked and answered to ensure understanding.We discussed that the single most powerful action that she can take to decrease her risk of developing lung cancer is to quit smoking. We discussed whether or not she is ready to commit to setting a quit date. We discussed options for tools to aid in quitting smoking including nicotine replacement therapy, non-nicotine medications, support groups, Quit Smart classes, and behavior modification. We discussed that often times setting smaller, more achievable goals, such as eliminating 1 cigarette a day for a week and then 2 cigarettes a day for a week can be helpful in slowly decreasing the number of cigarettes smoked. This allows for a sense of accomplishment as well as providing a clinical benefit. I provided  her  with smoking cessation  information  with contact information for community resources, classes, free nicotine replacement therapy, and access to mobile apps, text messaging, and on-line smoking cessation help. I have also provided  her  the office  contact information in the event she needs to contact me, or the screening staff. We discussed the time and location of the scan, and that either Abigail Miyamoto RN, Karlton Lemon, RN  or I will call / send a letter with the results within 24-72 hours of receiving them. The patient verbalized understanding of all of  the above and had no further questions upon leaving the office. They have my contact information in the event they have any further questions.  I spent 4 minutes counseling on smoking cessation and the health risks of continued tobacco abuse.  I explained to the patient that there has been a high incidence of coronary artery disease noted on these exams. I explained that this is a non-gated exam therefore degree or severity cannot be determined. This patient is not on statin therapy. I have asked the patient to follow-up with their PCP regarding any incidental finding of coronary artery disease and management with diet or medication as their PCP  feels is clinically indicated. The patient verbalized understanding of the above and had no further questions upon completion of the visit.      Bevelyn Ngo, NP 10/08/2021

## 2021-10-08 NOTE — Patient Instructions (Signed)
Thank you for participating in the [facility]. It was our pleasure to meet you today. We will call you with the results of your scan within the next few days. Your scan will be assigned a Lung RADS category score by the physicians reading the scans.  This Lung RADS score determines follow up scanning.  See below for description of categories, and follow up screening recommendations. We will be in touch to schedule your follow up screening annually or based on recommendations of our providers. We will fax a copy of your scan results to your Primary Care Physician, or the physician who referred you to the program, to ensure they have the results. Please call the office if you have any questions or concerns regarding your scanning experience or results.  Our office number is [phone]. Please speak with Denise Phelps, RN. , or  Denise Buckner RN, They are  our Lung Cancer Screening RN.'s If They are unavailable when you call, Please leave a message on the voice mail. We will return your call at our earliest convenience.This voice mail is monitored several times a day.  Remember, if your scan is normal, we will scan you annually as long as you continue to meet the criteria for the program. (Age 55-77, Current smoker or smoker who has quit within the last 15 years). If you are a smoker, remember, quitting is the single most powerful action that you can take to decrease your risk of lung cancer and other pulmonary, breathing related problems. We know quitting is hard, and we are here to help.  Please let us know if there is anything we can do to help you meet your goal of quitting. If you are a former smoker, congratulations. We are proud of you! Remain smoke free! Remember you can refer friends or family members through the number above.  We will screen them to make sure they meet criteria for the program. Thank you for helping us take better care of you by  participating in Lung Screening.  You can receive free nicotine replacement therapy ( patches, gum or mints) by calling 1-800-QUIT NOW. Please call so we can get you on the path to becoming  a non-smoker. I know it is hard, but you can do this!  Lung RADS Categories:  Lung RADS 1: no nodules or definitely non-concerning nodules.  Recommendation is for a repeat annual scan in 12 months.  Lung RADS 2:  nodules that are non-concerning in appearance and behavior with a very low likelihood of becoming an active cancer. Recommendation is for a repeat annual scan in 12 months.  Lung RADS 3: nodules that are probably non-concerning , includes nodules with a low likelihood of becoming an active cancer.  Recommendation is for a 6-month repeat screening scan. Often noted after an upper respiratory illness. We will be in touch to make sure you have no questions, and to schedule your 6-month scan.  Lung RADS 4 A: nodules with concerning findings, recommendation is most often for a follow up scan in 3 months or additional testing based on our provider's assessment of the scan. We will be in touch to make sure you have no questions and to schedule the recommended 3 month follow up scan.  Lung RADS 4 B:  indicates findings that are concerning. We will be in touch with you to schedule additional diagnostic testing based on our provider's  assessment of the scan.  Other options for assistance in smoking cessation (   As covered by your insurance benefits)  Hypnosis for smoking cessation  [employer] [phone]  Acupuncture for smoking cessation  [facility] [phone]   

## 2021-10-09 ENCOUNTER — Ambulatory Visit (INDEPENDENT_AMBULATORY_CARE_PROVIDER_SITE_OTHER): Payer: BC Managed Care – PPO

## 2021-10-09 DIAGNOSIS — Z122 Encounter for screening for malignant neoplasm of respiratory organs: Secondary | ICD-10-CM

## 2021-10-09 DIAGNOSIS — Z87891 Personal history of nicotine dependence: Secondary | ICD-10-CM

## 2021-10-09 DIAGNOSIS — F17210 Nicotine dependence, cigarettes, uncomplicated: Secondary | ICD-10-CM

## 2021-10-11 ENCOUNTER — Other Ambulatory Visit: Payer: Self-pay | Admitting: Acute Care

## 2021-10-11 DIAGNOSIS — F17210 Nicotine dependence, cigarettes, uncomplicated: Secondary | ICD-10-CM

## 2021-10-11 DIAGNOSIS — Z87891 Personal history of nicotine dependence: Secondary | ICD-10-CM

## 2021-10-11 DIAGNOSIS — Z122 Encounter for screening for malignant neoplasm of respiratory organs: Secondary | ICD-10-CM

## 2021-10-23 DIAGNOSIS — J439 Emphysema, unspecified: Secondary | ICD-10-CM | POA: Diagnosis not present

## 2021-10-23 DIAGNOSIS — Z720 Tobacco use: Secondary | ICD-10-CM | POA: Diagnosis not present

## 2021-10-23 DIAGNOSIS — E785 Hyperlipidemia, unspecified: Secondary | ICD-10-CM | POA: Diagnosis not present

## 2021-12-26 DIAGNOSIS — E785 Hyperlipidemia, unspecified: Secondary | ICD-10-CM | POA: Diagnosis not present

## 2022-07-30 DIAGNOSIS — E785 Hyperlipidemia, unspecified: Secondary | ICD-10-CM | POA: Diagnosis not present

## 2022-08-04 DIAGNOSIS — E785 Hyperlipidemia, unspecified: Secondary | ICD-10-CM | POA: Diagnosis not present

## 2022-08-04 DIAGNOSIS — I10 Essential (primary) hypertension: Secondary | ICD-10-CM | POA: Diagnosis not present

## 2022-08-04 DIAGNOSIS — Z0000 Encounter for general adult medical examination without abnormal findings: Secondary | ICD-10-CM | POA: Diagnosis not present

## 2022-08-04 DIAGNOSIS — Z720 Tobacco use: Secondary | ICD-10-CM | POA: Diagnosis not present

## 2022-09-14 ENCOUNTER — Other Ambulatory Visit: Payer: Self-pay | Admitting: Acute Care

## 2022-09-14 DIAGNOSIS — Z122 Encounter for screening for malignant neoplasm of respiratory organs: Secondary | ICD-10-CM

## 2022-09-14 DIAGNOSIS — F17210 Nicotine dependence, cigarettes, uncomplicated: Secondary | ICD-10-CM

## 2022-09-14 DIAGNOSIS — Z87891 Personal history of nicotine dependence: Secondary | ICD-10-CM

## 2022-10-13 ENCOUNTER — Ambulatory Visit (INDEPENDENT_AMBULATORY_CARE_PROVIDER_SITE_OTHER): Payer: BC Managed Care – PPO

## 2022-10-13 DIAGNOSIS — Z87891 Personal history of nicotine dependence: Secondary | ICD-10-CM

## 2022-10-13 DIAGNOSIS — F17210 Nicotine dependence, cigarettes, uncomplicated: Secondary | ICD-10-CM

## 2022-10-13 DIAGNOSIS — Z122 Encounter for screening for malignant neoplasm of respiratory organs: Secondary | ICD-10-CM

## 2022-10-21 ENCOUNTER — Other Ambulatory Visit: Payer: Self-pay | Admitting: Acute Care

## 2022-10-21 DIAGNOSIS — Z122 Encounter for screening for malignant neoplasm of respiratory organs: Secondary | ICD-10-CM

## 2022-10-21 DIAGNOSIS — Z87891 Personal history of nicotine dependence: Secondary | ICD-10-CM

## 2022-10-21 DIAGNOSIS — F17210 Nicotine dependence, cigarettes, uncomplicated: Secondary | ICD-10-CM

## 2023-07-30 DIAGNOSIS — I10 Essential (primary) hypertension: Secondary | ICD-10-CM | POA: Diagnosis not present

## 2023-07-30 DIAGNOSIS — Z0000 Encounter for general adult medical examination without abnormal findings: Secondary | ICD-10-CM | POA: Diagnosis not present

## 2023-07-30 DIAGNOSIS — E785 Hyperlipidemia, unspecified: Secondary | ICD-10-CM | POA: Diagnosis not present

## 2023-08-06 DIAGNOSIS — Z720 Tobacco use: Secondary | ICD-10-CM | POA: Diagnosis not present

## 2023-08-06 DIAGNOSIS — I10 Essential (primary) hypertension: Secondary | ICD-10-CM | POA: Diagnosis not present

## 2023-08-06 DIAGNOSIS — E785 Hyperlipidemia, unspecified: Secondary | ICD-10-CM | POA: Diagnosis not present

## 2023-08-06 DIAGNOSIS — I700 Atherosclerosis of aorta: Secondary | ICD-10-CM | POA: Diagnosis not present

## 2023-08-06 DIAGNOSIS — Z0000 Encounter for general adult medical examination without abnormal findings: Secondary | ICD-10-CM | POA: Diagnosis not present

## 2023-08-17 ENCOUNTER — Other Ambulatory Visit (HOSPITAL_BASED_OUTPATIENT_CLINIC_OR_DEPARTMENT_OTHER): Payer: Self-pay | Admitting: Family Medicine

## 2023-08-17 DIAGNOSIS — E785 Hyperlipidemia, unspecified: Secondary | ICD-10-CM

## 2023-08-26 ENCOUNTER — Other Ambulatory Visit (HOSPITAL_COMMUNITY)

## 2023-09-02 ENCOUNTER — Ambulatory Visit (HOSPITAL_COMMUNITY)
Admission: RE | Admit: 2023-09-02 | Discharge: 2023-09-02 | Disposition: A | Discharge status: Home / Self Care | Payer: Self-pay | Source: Ambulatory Visit | Attending: Family Medicine | Admitting: Family Medicine

## 2023-09-02 DIAGNOSIS — E785 Hyperlipidemia, unspecified: Secondary | ICD-10-CM | POA: Insufficient documentation

## 2023-09-07 DIAGNOSIS — Z1211 Encounter for screening for malignant neoplasm of colon: Secondary | ICD-10-CM | POA: Diagnosis not present

## 2023-09-07 DIAGNOSIS — Z1212 Encounter for screening for malignant neoplasm of rectum: Secondary | ICD-10-CM | POA: Diagnosis not present

## 2023-09-12 LAB — COLOGUARD: COLOGUARD: NEGATIVE

## 2023-09-12 LAB — EXTERNAL GENERIC LAB PROCEDURE: COLOGUARD: NEGATIVE

## 2023-10-14 ENCOUNTER — Ambulatory Visit

## 2023-10-14 DIAGNOSIS — Z122 Encounter for screening for malignant neoplasm of respiratory organs: Secondary | ICD-10-CM | POA: Diagnosis not present

## 2023-10-14 DIAGNOSIS — F17210 Nicotine dependence, cigarettes, uncomplicated: Secondary | ICD-10-CM | POA: Diagnosis not present

## 2023-10-14 DIAGNOSIS — Z87891 Personal history of nicotine dependence: Secondary | ICD-10-CM

## 2023-10-29 ENCOUNTER — Other Ambulatory Visit: Payer: Self-pay | Admitting: Acute Care

## 2023-10-29 DIAGNOSIS — Z122 Encounter for screening for malignant neoplasm of respiratory organs: Secondary | ICD-10-CM

## 2023-10-29 DIAGNOSIS — F17210 Nicotine dependence, cigarettes, uncomplicated: Secondary | ICD-10-CM

## 2023-10-29 DIAGNOSIS — Z87891 Personal history of nicotine dependence: Secondary | ICD-10-CM

## 2024-01-01 DIAGNOSIS — B9689 Other specified bacterial agents as the cause of diseases classified elsewhere: Secondary | ICD-10-CM | POA: Diagnosis not present

## 2024-01-01 DIAGNOSIS — J069 Acute upper respiratory infection, unspecified: Secondary | ICD-10-CM | POA: Diagnosis not present

## 2024-01-01 DIAGNOSIS — B379 Candidiasis, unspecified: Secondary | ICD-10-CM | POA: Diagnosis not present

## 2024-01-01 DIAGNOSIS — R051 Acute cough: Secondary | ICD-10-CM | POA: Diagnosis not present
